# Patient Record
Sex: Male | Born: 1950 | Race: White | Hispanic: No | Marital: Married | Smoking: Never smoker
Health system: Southern US, Community
[De-identification: ages and names within clinical notes are randomized; demographics above are authoritative.]

---

## 2016-07-13 ENCOUNTER — Encounter: Payer: Self-pay | Admitting: Neurology

## 2016-07-17 ENCOUNTER — Other Ambulatory Visit: Payer: Self-pay | Admitting: Otolaryngology

## 2016-07-17 DIAGNOSIS — H818X1 Other disorders of vestibular function, right ear: Secondary | ICD-10-CM

## 2016-07-17 DIAGNOSIS — H903 Sensorineural hearing loss, bilateral: Secondary | ICD-10-CM

## 2016-07-18 ENCOUNTER — Other Ambulatory Visit: Payer: Self-pay | Admitting: Otolaryngology

## 2016-07-18 DIAGNOSIS — Z77018 Contact with and (suspected) exposure to other hazardous metals: Secondary | ICD-10-CM

## 2016-08-03 ENCOUNTER — Ambulatory Visit
Admission: RE | Admit: 2016-08-03 | Discharge: 2016-08-03 | Disposition: A | Discharge status: Home / Self Care | Payer: 59 | Source: Ambulatory Visit | Attending: Otolaryngology | Admitting: Otolaryngology

## 2016-08-03 DIAGNOSIS — H818X1 Other disorders of vestibular function, right ear: Secondary | ICD-10-CM

## 2016-08-03 DIAGNOSIS — Z77018 Contact with and (suspected) exposure to other hazardous metals: Secondary | ICD-10-CM

## 2016-08-03 DIAGNOSIS — H903 Sensorineural hearing loss, bilateral: Secondary | ICD-10-CM

## 2016-08-03 MED ORDER — GADOBENATE DIMEGLUMINE 529 MG/ML IV SOLN: 15.0000 mL | Freq: Once | INTRAVENOUS | Status: DC | PRN

## 2016-08-07 ENCOUNTER — Telehealth: Payer: Self-pay | Admitting: Neurology

## 2016-08-07 ENCOUNTER — Ambulatory Visit (INDEPENDENT_AMBULATORY_CARE_PROVIDER_SITE_OTHER): Payer: 59 | Admitting: Neurology

## 2016-08-07 ENCOUNTER — Encounter: Payer: Self-pay | Admitting: Neurology

## 2016-08-07 VITALS — BP 140/84 | HR 68 | Ht 68.0 in | Wt 177.0 lb

## 2016-08-07 DIAGNOSIS — H8120 Vestibular neuronitis, unspecified ear: Secondary | ICD-10-CM | POA: Diagnosis not present

## 2016-08-07 NOTE — Progress Notes (Signed)
NEUROLOGY CONSULTATION NOTE  [name] MRN: [id number] DOB: May 02, 1950  Referring provider: Dr. Dorma Russell Primary care provider: Dr. Jeanie Sewer  Reason for consult:  dizziness  HISTORY OF PRESENT ILLNESS: Tyrone Boyd is a 66 year old right-handed male who presents for recurrent episodes of vertigo with chronic disequilibrium.  History supplemented by Dr. Donaciano Eva note.  In 2000, he had a severe episode of vertigo and vomiting.  He described it as a spinning sensation associated with left aura fullness and nausea and vomiting.  It lasted about 3 or 4 hours.  He had about 2 episodes at that time.  He was subsequently diagnosed with Meniere's disease.  He had a recurrent episode in 2010 and was advised to start a low-sodium diet.  He didn't have a recurrent attack until December 2017.  At that time, he developed a possible viral illness.  He reports having chills with bilateral ear pain and pressure.  After a couple of days, he developed positional spinning that was relieved by staying still.  He had associated nausea and vomiting.  He was treated with meclizine, diazepam, an anti-emetic, and antibiotics. The severe vertigo lasted about a week.  He then was significantly unsteady for another week, where he stumbled.  He subsequently improved but continues to have mild residual symptoms of dizziness.  Some days are better than others.  This is different than his previous bouts of dizziness.  He denies headache, double vision, tinnitus, hearing loss or aural fullness.  He was  more recently evaluated by otolaryngologist, Dr. Dorma Russell.  He was given a course of prednisone for possible vestibular neuritis, but it was ineffective.  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  Low sodium diet was therefore discontinued.  MRI of brain with and without contrast from 08/03/16 was personally reviewed and revealed some  chronic small vessel ischemic changes but no acute abnormality, abnormal enhancement or mass lesion that would be the cause of his symptoms.    He has no history of headache or migraine.  PAST MEDICAL HISTORY: No past medical history on file.  PAST SURGICAL HISTORY: No past surgical history on file.  MEDICATIONS: No current outpatient prescriptions on file prior to visit.   No current facility-administered medications on file prior to visit.     ALLERGIES: Allergies  Allergen Reactions  . Penicillins     FAMILY HISTORY: Family History  Problem Relation Age of Onset  . Down syndrome Mother        drowned  . Stroke Father     SOCIAL HISTORY: Social History   Social History  . Marital status: Married    Spouse name: N/A  . Number of children: 0  . Years of education: 14   Occupational History  . Not on file.   Social History Main Topics  . Smoking status: Never Smoker  . Smokeless tobacco: Never Used  . Alcohol use No  . Drug use: No  . Sexual activity: Not on file   Other Topics Concern  . Not on file   Social History Narrative   Lives with wife in a 2 story home.  Has no children.  Works with [employer].  Education: 2 years of college.    REVIEW OF SYSTEMS: Constitutional: No fevers, chills, or sweats, no generalized fatigue, change in appetite Eyes: No visual changes, double vision, eye pain Ear, nose and throat: No hearing loss, ear pain, nasal congestion, sore  throat Cardiovascular: No chest pain, palpitations Respiratory:  No shortness of breath at rest or with exertion, wheezes GastrointestinaI: No nausea, vomiting, diarrhea, abdominal pain, fecal incontinence Genitourinary:  No dysuria, urinary retention or frequency Musculoskeletal:  No neck pain, back pain Integumentary: No rash, pruritus, skin lesions Neurological: as above Psychiatric: No depression, insomnia, anxiety Endocrine: No palpitations, fatigue, diaphoresis, mood swings, change  in appetite, change in weight, increased thirst Hematologic/Lymphatic:  No purpura, petechiae. Allergic/Immunologic: no itchy/runny eyes, nasal congestion, recent allergic reactions, rashes  PHYSICAL EXAM: BP 140/84, Pulse 68 bpm, SpO2 98%, Wt 177 lb, Ht 5'8" General: No acute distress.  Patient appears well-groomed.  Head:  Normocephalic/atraumatic Ears:  Canal and TM clear  Eyes:  fundi examined but not visualized Neck: supple, no paraspinal tenderness, full range of motion Back: No paraspinal tenderness Heart: regular rate and rhythm Lungs: Clear to auscultation bilaterally. Vascular: No carotid bruits. Neurological Exam: Mental status: alert and oriented to person, place, and time, recent and remote memory intact, fund of knowledge intact, attention and concentration intact, speech fluent and not dysarthric, language intact. Cranial nerves: CN I: not tested CN II: pupils equal, round and reactive to light, visual fields intact CN III, IV, VI:  full range of motion, no nystagmus, no ptosis CN V: facial sensation intact CN VII: upper and lower face symmetric CN VIII: hearing intact CN IX, X: gag intact, uvula midline CN XI: sternocleidomastoid and trapezius muscles intact CN XII: tongue midline Bulk & Tone: normal, no fasciculations. Motor:  5/5 throughout  Sensation: temperature and vibration sensation intact. Deep Tendon Reflexes:  2+ throughout, toes downgoing.  Finger to nose testing:  Without dysmetria.  Heel to shin:  Without dysmetria.  Gait:  Normal station and stride.  Able to turn and tandem walk. Romberg negative. Head Impulse Test:  Positive, more pronounced to the right than the left.  IMPRESSION: Vestibular neuritis.  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  It is not consistent with BPPV or vestibular migraine (given his persistent symptoms).  Testing was negative for Meniere's disease.  Head Impulse Test was positive, indicating a  peripheral etiology.  Only time will tell how much he will continue to improve.  He says he hasn't noticed any more improvement over the past 3 to 4 months, so these symptoms may unfortunately be permanent.    Management is supportive.  He may consider vestibular rehabilitation.  If he has acute attacks, clonazepam may help.  No follow up is indicated.  Thank you for allowing me to take part in the care of this patient.  Shon MilletAdam Mikeila Burgen, DO  CC:  Ermalinda BarriosEric Kraus, MD  Gwendlyn DeutscherJohn Redding II, MD

## 2016-08-07 NOTE — Telephone Encounter (Signed)
He would like a copy of his office note from today's visit with Dr. Everlena CooperJaffe mailed to him. Thanks

## 2016-08-07 NOTE — Telephone Encounter (Signed)
OV note mailed to patient.

## 2016-08-07 NOTE — Patient Instructions (Signed)
I agree with Dr. Dorma RussellKraus that you probably have damage to the nerve in your ear due to the viral illness.  Unfortunately, you continue to have residual symptoms.  It may continue to get better but it also may be permanent.  Consider seeing a physical therapist for vestibular rehabilitation.  For severe attacks, your PCP may want to provide you clonazepam.

## 2017-11-11 IMAGING — MR MR HEAD WO/W CM
11 of 12 series · 39 of 48 positions shown · IV contrast (multihance)
Comparison: None.

CLINICAL DATA: 65 y/o M; severe vertigo with occasional ringing in
his ears since [DATE]. Visual changes and difficulty walking. History
of Meniere's disease 15 years ago.

Creatinine was obtained on site at [HOSPITAL] at [HOSPITAL].
Results: Creatinine 1.1 mg/dL.
EXAM:
MRI HEAD WITHOUT AND WITH CONTRAST
TECHNIQUE: Multiplanar, multiecho pulse sequences of the brain and surrounding
structures were obtained without and with intravenous contrast.
Internal auditory canal protocol.
CONTRAST:  15 cc MultiHance.

[Series 2: T1 · sagittal · 5.0mm · 0.45mm/px · 3 of 21 slices shown (1 of 3)]
[im 1/21]
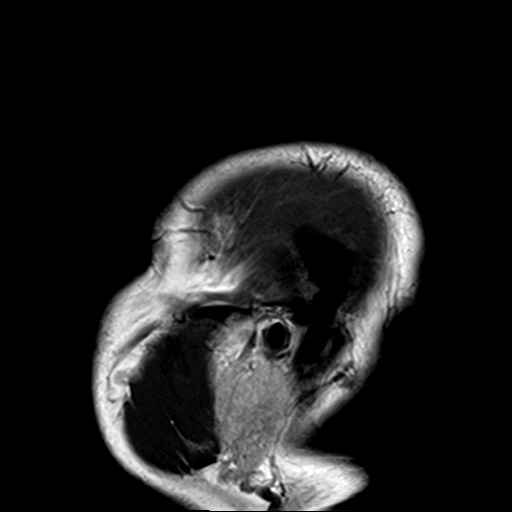
[im 11/21]
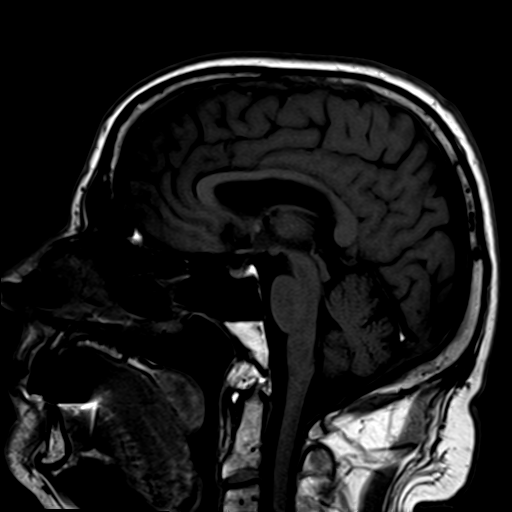
[im 21/21]
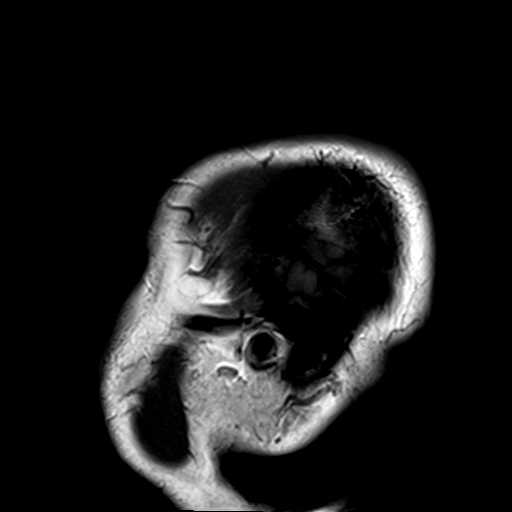

[Series 3: DWI · axial · 3.0mm · 1.80mm/px · z∈[+33,+178]mm · 11 of 100 slices shown (1 of 2)]
[im 1/100]
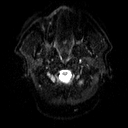
[im 10/100]
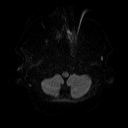
[im 20/100]
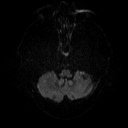
[im 30/100]
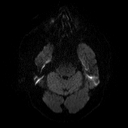
[im 40/100]
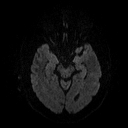
[im 50/100]
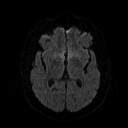
[im 60/100]
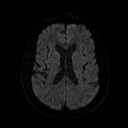
[im 70/100]
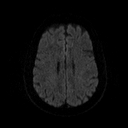
[im 80/100]
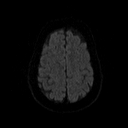
[im 90/100]
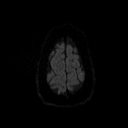
[im 100/100]
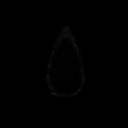

[Series 4: DWI · axial · 3.0mm · 1.80mm/px · z∈[+33,+178]mm · 5 of 48 slices shown (2 of 2)]
[im 1/48]
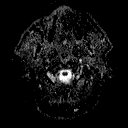
[im 12/48]
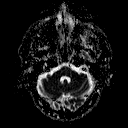
[im 24/48]
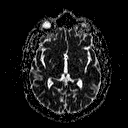
[im 36/48]
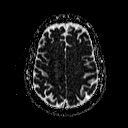
[im 48/48]
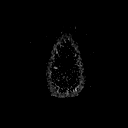

[Series 5: T2 · axial · 5.0mm · 0.45mm/px · z∈[+32,+173]mm · 2 of 23 slices shown]
[im 1/23]
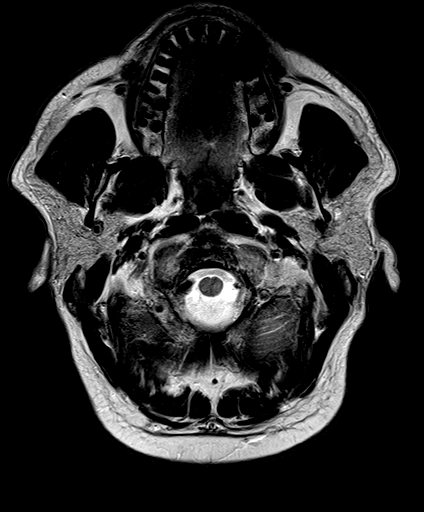
[im 23/23]
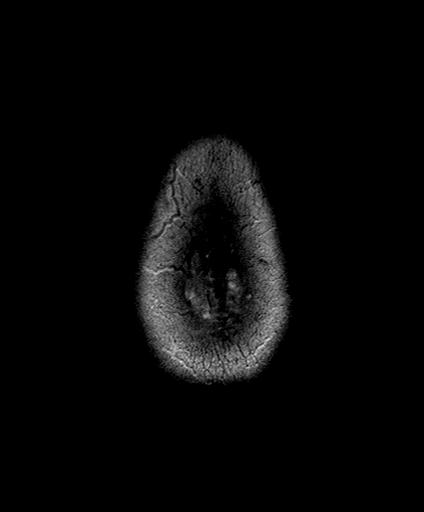

[Series 6: FLAIR · axial · 3.0mm · 0.45mm/px · z∈[+44,+177]mm · 3 of 30 slices shown]
[im 1/30]
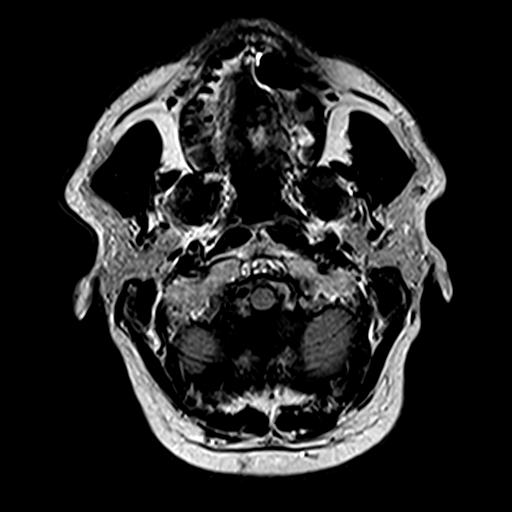
[im 15/30]
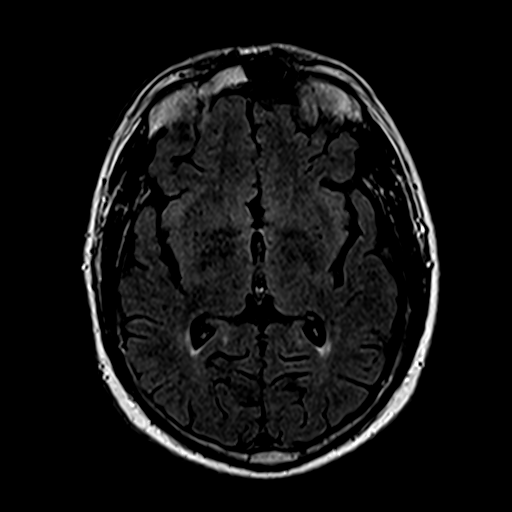
[im 30/30]
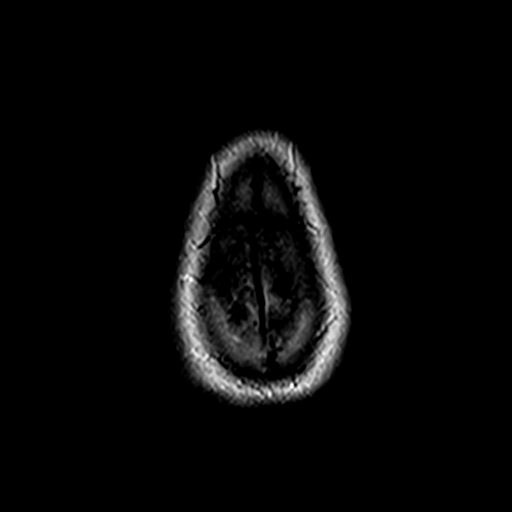

[Series 8: swi_images · axial · 2.0mm · 0.90mm/px · z∈[+23,+179]mm · 8 of 80 slices shown]
[im 1/80]
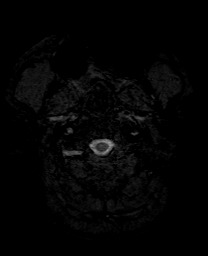
[im 12/80]
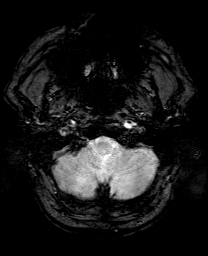
[im 23/80]
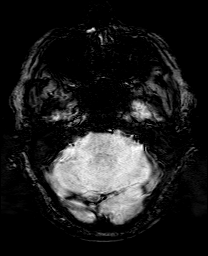
[im 34/80]
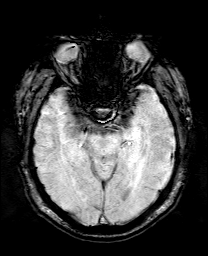
[im 46/80]
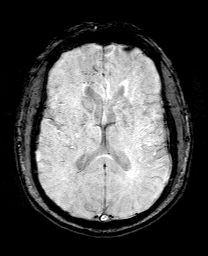
[im 57/80]
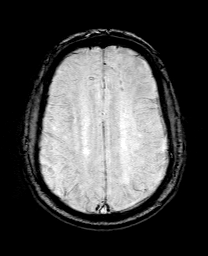
[im 68/80]
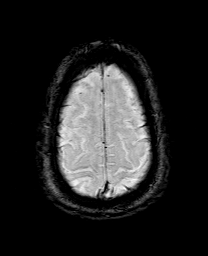
[im 80/80]
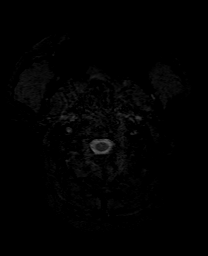

[Series 9: T1 · coronal · 3.0mm · 0.35mm/px · 1 of 11 slices shown (2 of 3)]
[im 1/11]
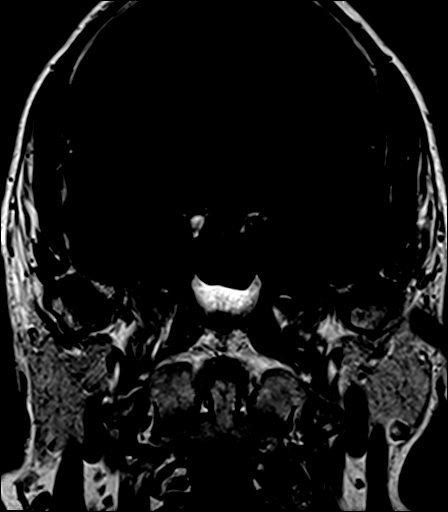

[Series 10: T1 · axial · 3.0mm · 0.35mm/px · 1 of 11 slices shown (3 of 3)]
[im 1/11]
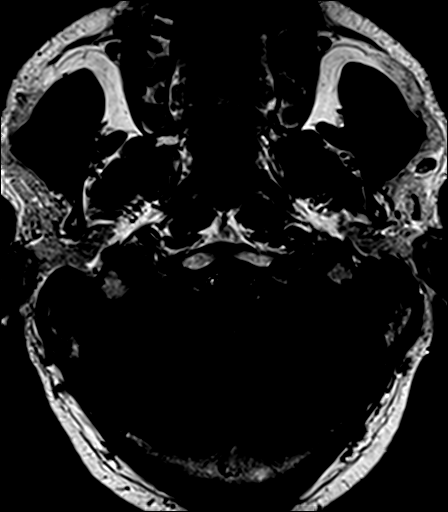

[Series 11: bSSFP · axial · 1.0mm · 0.28mm/px · z∈[+38,+61]mm · 3 of 36 slices shown]
[im 1/36]
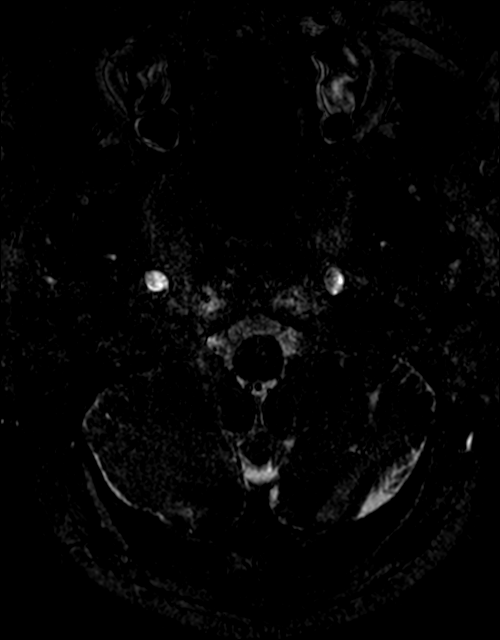
[im 12/36]
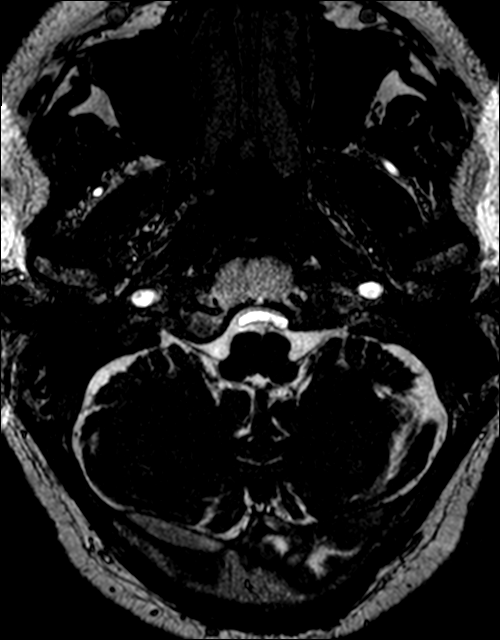
[im 24/36]
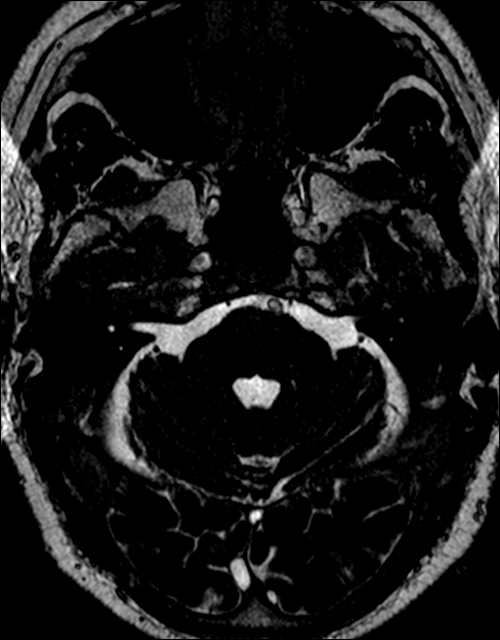

[Series 12: T1 post-contrast · coronal · 3.0mm · 0.35mm/px · 1 of 11 slices shown (1 of 2)]
[im 1/11]
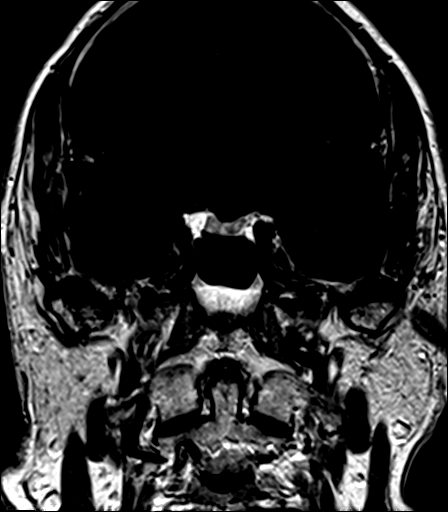

[Series 13: T1 post-contrast · axial · 3.0mm · 0.35mm/px · 1 of 11 slices shown (2 of 2)]
[im 1/11]
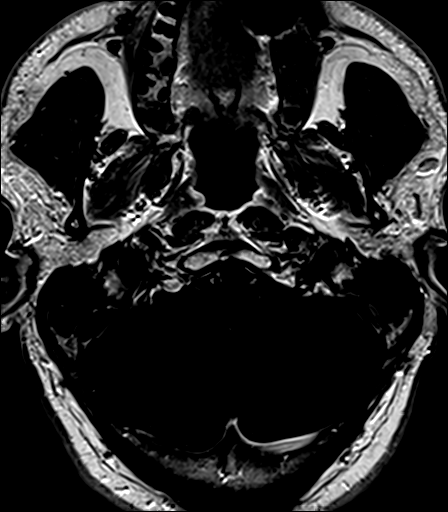

[39 of 48 positions shown; findings below may reference images not displayed]

FINDINGS: Brain: No acute infarction, hemorrhage, hydrocephalus, extra-axial
collection or mass lesion. No abnormal enhancement. Nonspecific foci
of T2 FLAIR hyperintense signal abnormality in subcortical and
periventricular white matter with frontal parietal predominance is
compatible with mild chronic microvascular ischemic changes. Small
foci of susceptibility hypointensity within left frontal lobe are
compatible with hemosiderin deposition of old microhemorrhage.

Internal auditory canal: No mass or abnormal enhancement. Cranial
nerve 7 and 8 complexes are intact. Inner ear structures are
morphologically normal and demonstrate normal signal. No significant
neurovascular compression.

Vascular: Normal flow voids.

Skull and upper cervical spine: Normal marrow signal.

Sinuses/Orbits: Negative.

Other: None.
IMPRESSION: 1. No mass or abnormal enhancement of the cranial nerve 7 and 8
complexes. Inner ear structures are morphologically normal with
normal signal.
2. No acute abnormality of the brain or abnormal enhancement.
3. Mild chronic microvascular ischemic changes and mild parenchymal
volume loss of the brain.

By: Jamiee Sexton M.D.

## 2018-08-22 DIAGNOSIS — L0390 Cellulitis, unspecified: Secondary | ICD-10-CM | POA: Diagnosis not present

## 2018-08-24 DIAGNOSIS — R509 Fever, unspecified: Secondary | ICD-10-CM | POA: Diagnosis not present

## 2018-08-24 DIAGNOSIS — N3001 Acute cystitis with hematuria: Secondary | ICD-10-CM | POA: Diagnosis not present

## 2018-08-24 DIAGNOSIS — Z20828 Contact with and (suspected) exposure to other viral communicable diseases: Secondary | ICD-10-CM | POA: Diagnosis not present

## 2018-08-24 DIAGNOSIS — R51 Headache: Secondary | ICD-10-CM | POA: Diagnosis not present

## 2019-03-25 DIAGNOSIS — H524 Presbyopia: Secondary | ICD-10-CM | POA: Diagnosis not present

## 2019-03-25 DIAGNOSIS — H35372 Puckering of macula, left eye: Secondary | ICD-10-CM | POA: Diagnosis not present

## 2019-03-25 DIAGNOSIS — H2511 Age-related nuclear cataract, right eye: Secondary | ICD-10-CM | POA: Diagnosis not present

## 2019-03-25 DIAGNOSIS — H31002 Unspecified chorioretinal scars, left eye: Secondary | ICD-10-CM | POA: Diagnosis not present

## 2019-04-29 DIAGNOSIS — R69 Illness, unspecified: Secondary | ICD-10-CM | POA: Diagnosis not present

## 2019-11-09 DIAGNOSIS — Z131 Encounter for screening for diabetes mellitus: Secondary | ICD-10-CM | POA: Diagnosis not present

## 2019-11-09 DIAGNOSIS — Z0000 Encounter for general adult medical examination without abnormal findings: Secondary | ICD-10-CM | POA: Diagnosis not present

## 2019-11-09 DIAGNOSIS — Z6827 Body mass index (BMI) 27.0-27.9, adult: Secondary | ICD-10-CM | POA: Diagnosis not present

## 2019-11-09 DIAGNOSIS — Z13220 Encounter for screening for lipoid disorders: Secondary | ICD-10-CM | POA: Diagnosis not present

## 2019-11-09 DIAGNOSIS — Z2821 Immunization not carried out because of patient refusal: Secondary | ICD-10-CM | POA: Diagnosis not present

## 2019-11-09 DIAGNOSIS — Z125 Encounter for screening for malignant neoplasm of prostate: Secondary | ICD-10-CM | POA: Diagnosis not present

## 2019-11-09 DIAGNOSIS — Z1331 Encounter for screening for depression: Secondary | ICD-10-CM | POA: Diagnosis not present

## 2019-11-09 DIAGNOSIS — Z79899 Other long term (current) drug therapy: Secondary | ICD-10-CM | POA: Diagnosis not present
